# Patient Record
Sex: Female | Born: 1945 | Race: Black or African American | Hispanic: No | Marital: Married | Smoking: Former smoker
Health system: Southern US, Community
[De-identification: ages and names within clinical notes are randomized; demographics above are authoritative.]

## PROBLEM LIST (undated history)

## (undated) DIAGNOSIS — R51 Headache: Secondary | ICD-10-CM

## (undated) DIAGNOSIS — M1990 Unspecified osteoarthritis, unspecified site: Secondary | ICD-10-CM

## (undated) DIAGNOSIS — Z9889 Other specified postprocedural states: Secondary | ICD-10-CM

## (undated) DIAGNOSIS — K219 Gastro-esophageal reflux disease without esophagitis: Secondary | ICD-10-CM

## (undated) DIAGNOSIS — R112 Nausea with vomiting, unspecified: Secondary | ICD-10-CM

## (undated) HISTORY — PX: EYE SURGERY: SHX253

## (undated) HISTORY — PX: APPENDECTOMY: SHX54

---

## 2007-02-04 ENCOUNTER — Ambulatory Visit (HOSPITAL_COMMUNITY): Admission: RE | Admit: 2007-02-04 | Discharge: 2007-02-05 | Payer: Self-pay | Admitting: Orthopedic Surgery

## 2010-11-12 NOTE — Op Note (Signed)
NAME:  Rita Nixon, Rita Nixon              ACCOUNT NO.:  [id number]   MEDICAL RECORD NO.:  [id number]          PATIENT TYPE:  AMB   LOCATION:  DAY                          FACILITY:  [facility]   PHYSICIAN:  Marlowe Kays, M.D.  DATE OF BIRTH:  [birth year]   DATE OF PROCEDURE:  02/04/2007  DATE OF DISCHARGE:                               OPERATIVE REPORT   PREOPERATIVE DIAGNOSES:  1. Torn rotator cuff.  2. Osteoarthritis, acromioclavicular joint, right shoulder.   POSTOPERATIVE DIAGNOSES:  1. Torn rotator cuff.  2. Osteoarthritis, acromioclavicular joint, right shoulder.   OPERATION:  1. Open distal clavicle resection.  2. Open anterior acromionectomy and repair of complex rotator cuff      tear, right shoulder.   SURGEON:  Marlowe Kays, M.D.   ASSISTANTDruscilla Brownie. Idolina Primer, P.A.-C.   ANESTHESIA:  General.   PATHOLOGY AND JUSTIFICATION FOR PROCEDURE:  She has had pain in the  right shoulder since around the first of this year and had an MRI  performed on September 14, 2006 at [facility] which showed a full-  thickness retracted rotator cuff tear.  I saw her in the office on October 05, 2006.  She had tenderness of the [facility] joint was arthritic changes.  I  recommended repair of the rotator cuff; she is here at this time for  this repair.  Preoperatively, she was advised that because of the 4-cm  retraction of the rotator cuff, that it might be a little difficult  completely repairing the rotator cuff and that we might have to have her  arm abducted to her side.   PROCEDURE:  After prophylactic antibiotics, satisfactory general  anesthesia, beach-chair position on the Allen frame, right shoulder  girdle was prepped with DuraPrep and draped into a sterile field, Ioban  employed, time-out performed.  Incision was made along the distal  clavicle, curving inferiorly and laterally over the rotator cuff.  The  [facility] joint was identified and with subperiosteal dissection, the distal  clavicle and joint exposed.  I marked out a centimeter and a half from  the [facility] joint and then undermined the distal clavicle with a small  elevator and then protecting the underneath structures with a baby  Homan, I sectioned the clavicle with a micro saw, removing the fragment  with a towel clip and cautery technique.  Bone wax was placed over the  raw bone and I made sure that there were no remaining spicules.  I then  extended the incision lateral-ward and undermined with some difficulty  the anterior acromion with a small Cobb elevator.  She had very tight  impingement syndrome.  I made my initial anterior acromionectomy with  micro saw and then made several other passes with a saw and with a small  rongeur to obtain adequate decompression.  She had a tight fascia over  the humeral head, which I opened and partially resected.  Her tear was  very significant with basically anterior and posterior halves which I  could not bring completely together, even with the arm abducted.  She  had a very wide biceps tendon  and by utilizing it as a portion of the  repair between these 2 wings, as well as 3 rotator cuff anchors, I was  able to get a very nice repair.  Again, this was all performed with her  arm roughly 60 degrees from her body.  After completing what I  felt was  a stable repair, the wound was irrigated with sterile saline and soft  tissues were infiltrated with 0.5% Marcaine with adrenaline.  Gelfoam  was placed in the distal clavicle resection site and closure performed.  A small opening in the deltoid I closed with interrupted #1 Vicryl, as I  did the fascia over the clavicle and acromion.  The subcutaneous tissue  was closed with 2-0 Vicryl, Steri-Strips on the skin, dry sterile  dressing and shoulder immobilizer were applied with the arm abducted  about 60 degrees.  She tolerated the procedure well and was taken to the  recovery room in satisfactory condition with no known  complications.   ESTIMATED BLOOD LOSS:  Perhaps 100 mL, no blood replacement.           ______________________________  Marlowe Kays, M.D.     JA/MEDQ  D:  02/04/2007  T:  02/05/2007  Job:  [id number]

## 2012-11-24 ENCOUNTER — Other Ambulatory Visit: Payer: Self-pay | Admitting: Orthopedic Surgery

## 2012-11-26 ENCOUNTER — Encounter (HOSPITAL_COMMUNITY): Payer: Self-pay | Admitting: Pharmacy Technician

## 2012-12-01 ENCOUNTER — Encounter (HOSPITAL_COMMUNITY)
Admission: RE | Admit: 2012-12-01 | Discharge: 2012-12-01 | Disposition: A | Discharge status: Home / Self Care | Payer: Medicare Other | Source: Ambulatory Visit | Attending: Orthopedic Surgery | Admitting: Orthopedic Surgery

## 2012-12-01 ENCOUNTER — Encounter (HOSPITAL_COMMUNITY): Payer: Self-pay

## 2012-12-01 DIAGNOSIS — Z01812 Encounter for preprocedural laboratory examination: Secondary | ICD-10-CM | POA: Insufficient documentation

## 2012-12-01 HISTORY — DX: Headache: R51

## 2012-12-01 HISTORY — DX: Gastro-esophageal reflux disease without esophagitis: K21.9

## 2012-12-01 HISTORY — DX: Other specified postprocedural states: Z98.890

## 2012-12-01 HISTORY — DX: Unspecified osteoarthritis, unspecified site: M19.90

## 2012-12-01 HISTORY — DX: Nausea with vomiting, unspecified: R11.2

## 2012-12-01 LAB — CBC
HCT: 36.8 % (ref 36.0–46.0)
Hemoglobin: 12.2 g/dL (ref 12.0–15.0)
RDW: 12.6 % (ref 11.5–15.5)
WBC: 5.8 10*3/uL (ref 4.0–10.5)

## 2012-12-01 LAB — SURGICAL PCR SCREEN
MRSA, PCR: NEGATIVE
Staphylococcus aureus: NEGATIVE

## 2012-12-01 NOTE — Patient Instructions (Addendum)
20      Your procedure is scheduled on:  Wednesday 12/08/2012  Report to [facility] at  0815 AM.  Call this number if you have problems the morning of surgery: [phone]   Remember:             IF YOU USE CPAP,BRING MASK AND TUBING AM OF SURGERY!   Do not eat food or drink liquids AFTER MIDNIGHT!  Take these medicines the morning of surgery with A SIP OF WATER: NONE   Do not bring valuables to the hospital.  .  Leave suitcase in the car. After surgery it may be brought to your room.  For patients admitted to the hospital, checkout time is 11:00 AM the day of              Discharge.    DO NOT WEAR JEWELRY , MAKE-UP, LOTIONS,POWDERS,PERFUMES!             WOMEN -DO NOT SHAVE LEGS OR UNDERARMS 12 HRS. BEFORE  SURGERY!               MEN MAY SHAVE AS USUAL!             CONTACTS,DENTURES OR BRIDGEWORK, FALSE EYELASHES MAY  NOT BE WORN INTO SURGERY!                                           Patients discharged the day of surgery will not be allowed to drive home. If going home the same day of surgery, must have someone stay with you first 24 hrs.at home and arrange for someone to drive you home from the Hospital.                    YOUR DRIVER IS: Conrad-spouse   Special Instructions:             Please read over the following fact sheets that you were given:             1. [facility] PREPARING FOR SURGERY SHEET              2.MRSA INFORMATION              3.[facility]                                        Telford Nab.Nanney,RN,BSN     [phone]                FAILURE TO FOLLOW THESE INSTRUCTIONS MAY RESULT IN  CANCELLATION OF YOUR SURGERY!               Patient Signature:___________________________

## 2012-12-07 NOTE — Progress Notes (Signed)
Surgery time changed from 1045 to 12 on 12/08/12. Pt notified and agreed to be at short stay at 0930.

## 2012-12-08 ENCOUNTER — Encounter (HOSPITAL_COMMUNITY)
Admission: RE | Disposition: A | Discharge status: Home / Self Care | Payer: Self-pay | Source: Ambulatory Visit | Attending: Orthopedic Surgery

## 2012-12-08 ENCOUNTER — Encounter (HOSPITAL_COMMUNITY): Payer: Self-pay | Admitting: Anesthesiology

## 2012-12-08 ENCOUNTER — Ambulatory Visit (HOSPITAL_COMMUNITY): Payer: Medicare Other | Admitting: Anesthesiology

## 2012-12-08 ENCOUNTER — Ambulatory Visit (HOSPITAL_COMMUNITY)
Admission: RE | Admit: 2012-12-08 | Discharge: 2012-12-09 | Disposition: A | Discharge status: Home / Self Care | Payer: Medicare Other | Source: Ambulatory Visit | Attending: Orthopedic Surgery | Admitting: Orthopedic Surgery

## 2012-12-08 ENCOUNTER — Encounter (HOSPITAL_COMMUNITY): Payer: Self-pay

## 2012-12-08 DIAGNOSIS — M67919 Unspecified disorder of synovium and tendon, unspecified shoulder: Secondary | ICD-10-CM | POA: Insufficient documentation

## 2012-12-08 DIAGNOSIS — Z01812 Encounter for preprocedural laboratory examination: Secondary | ICD-10-CM | POA: Insufficient documentation

## 2012-12-08 DIAGNOSIS — Z7982 Long term (current) use of aspirin: Secondary | ICD-10-CM | POA: Insufficient documentation

## 2012-12-08 DIAGNOSIS — M19019 Primary osteoarthritis, unspecified shoulder: Secondary | ICD-10-CM | POA: Insufficient documentation

## 2012-12-08 DIAGNOSIS — K219 Gastro-esophageal reflux disease without esophagitis: Secondary | ICD-10-CM | POA: Insufficient documentation

## 2012-12-08 DIAGNOSIS — Z79899 Other long term (current) drug therapy: Secondary | ICD-10-CM | POA: Insufficient documentation

## 2012-12-08 DIAGNOSIS — M719 Bursopathy, unspecified: Secondary | ICD-10-CM | POA: Insufficient documentation

## 2012-12-08 DIAGNOSIS — M66329 Spontaneous rupture of flexor tendons, unspecified upper arm: Secondary | ICD-10-CM | POA: Insufficient documentation

## 2012-12-08 HISTORY — PX: SHOULDER OPEN ROTATOR CUFF REPAIR: SHX2407

## 2012-12-08 HISTORY — PX: RESECTION DISTAL CLAVICAL: SHX5053

## 2012-12-08 SURGERY — EXCISION, CLAVICLE, DISTAL, OPEN
Anesthesia: General | Site: Shoulder | Laterality: Left | Wound class: Clean

## 2012-12-08 MED ORDER — METOCLOPRAMIDE HCL 5 MG PO TABS
5.0000 mg | ORAL_TABLET | Freq: Three times a day (TID) | ORAL | Status: DC | PRN
  Filled 2012-12-08: qty 2

## 2012-12-08 MED ORDER — PHENOL 1.4 % MT LIQD: 1.0000 | OROMUCOSAL | Status: DC | PRN

## 2012-12-08 MED ORDER — METOCLOPRAMIDE HCL 5 MG/ML IJ SOLN: 5.0000 mg | Freq: Three times a day (TID) | INTRAMUSCULAR | Status: DC | PRN

## 2012-12-08 MED ORDER — BIOTIN 1000 MCG PO TABS: 1000.0000 ug | ORAL_TABLET | Freq: Every day | ORAL | Status: DC

## 2012-12-08 MED ORDER — CELECOXIB 200 MG PO CAPS
200.0000 mg | ORAL_CAPSULE | Freq: Every day | ORAL | Status: DC | PRN
  Filled 2012-12-08: qty 1

## 2012-12-08 MED ORDER — POVIDONE-IODINE 7.5 % EX SOLN
Freq: Once | CUTANEOUS | Status: AC
  Administered 2012-12-08: 1 via TOPICAL

## 2012-12-08 MED ORDER — HYDROMORPHONE HCL PF 1 MG/ML IJ SOLN
INTRAMUSCULAR | Status: AC
  Filled 2012-12-08: qty 1

## 2012-12-08 MED ORDER — PROMETHAZINE HCL 25 MG/ML IJ SOLN: 6.2500 mg | INTRAMUSCULAR | Status: DC | PRN

## 2012-12-08 MED ORDER — SODIUM CHLORIDE 0.9 % IV SOLN
INTRAVENOUS | Status: DC
  Administered 2012-12-08: 1000 mL via INTRAVENOUS

## 2012-12-08 MED ORDER — NEOSTIGMINE METHYLSULFATE 1 MG/ML IJ SOLN
INTRAMUSCULAR | Status: DC | PRN
  Administered 2012-12-08: 5 mg via INTRAVENOUS

## 2012-12-08 MED ORDER — METHOCARBAMOL 500 MG PO TABS
500.0000 mg | ORAL_TABLET | Freq: Four times a day (QID) | ORAL | Status: DC | PRN
  Administered 2012-12-08 – 2012-12-09 (×4): 500 mg via ORAL
  Filled 2012-12-08 (×4): qty 1

## 2012-12-08 MED ORDER — PANTOPRAZOLE SODIUM 40 MG PO TBEC
40.0000 mg | DELAYED_RELEASE_TABLET | Freq: Every day | ORAL | Status: DC
  Administered 2012-12-09: 40 mg via ORAL
  Filled 2012-12-08: qty 1

## 2012-12-08 MED ORDER — FENTANYL CITRATE 0.05 MG/ML IJ SOLN
INTRAMUSCULAR | Status: DC | PRN
  Administered 2012-12-08: 25 ug via INTRAVENOUS
  Administered 2012-12-08: 50 ug via INTRAVENOUS
  Administered 2012-12-08 (×2): 25 ug via INTRAVENOUS
  Administered 2012-12-08: 100 ug via INTRAVENOUS
  Administered 2012-12-08 (×3): 50 ug via INTRAVENOUS

## 2012-12-08 MED ORDER — ONDANSETRON HCL 4 MG/2ML IJ SOLN
INTRAMUSCULAR | Status: DC | PRN
  Administered 2012-12-08: 4 mg via INTRAVENOUS

## 2012-12-08 MED ORDER — ROPIVACAINE HCL 5 MG/ML IJ SOLN
INTRAMUSCULAR | Status: AC
  Filled 2012-12-08: qty 30

## 2012-12-08 MED ORDER — HYDROCODONE-ACETAMINOPHEN 7.5-325 MG PO TABS: 1.0000 | ORAL_TABLET | ORAL | Status: DC | PRN

## 2012-12-08 MED ORDER — PHENYLEPHRINE HCL 10 MG/ML IJ SOLN
10.0000 mg | INTRAVENOUS | Status: DC | PRN
  Administered 2012-12-08: 50 ug/min via INTRAVENOUS

## 2012-12-08 MED ORDER — DEXAMETHASONE SODIUM PHOSPHATE 4 MG/ML IJ SOLN
INTRAMUSCULAR | Status: DC | PRN
  Administered 2012-12-08: 10 mg via INTRAVENOUS

## 2012-12-08 MED ORDER — HYDROMORPHONE HCL PF 1 MG/ML IJ SOLN
0.2500 mg | INTRAMUSCULAR | Status: DC | PRN
  Administered 2012-12-08: 0.5 mg via INTRAVENOUS

## 2012-12-08 MED ORDER — ONDANSETRON HCL 4 MG/2ML IJ SOLN: 4.0000 mg | Freq: Four times a day (QID) | INTRAMUSCULAR | Status: DC | PRN

## 2012-12-08 MED ORDER — DIPHENHYDRAMINE HCL 12.5 MG/5ML PO ELIX: 12.5000 mg | ORAL_SOLUTION | Freq: Four times a day (QID) | ORAL | Status: DC | PRN

## 2012-12-08 MED ORDER — MEPERIDINE HCL 25 MG/ML IJ SOLN: 50.0000 mg | INTRAMUSCULAR | Status: DC | PRN

## 2012-12-08 MED ORDER — BUPIVACAINE-EPINEPHRINE (PF) 0.5% -1:200000 IJ SOLN
INTRAMUSCULAR | Status: AC
  Filled 2012-12-08: qty 10

## 2012-12-08 MED ORDER — DIPHENHYDRAMINE HCL 50 MG/ML IJ SOLN: 12.5000 mg | Freq: Once | INTRAMUSCULAR | Status: DC

## 2012-12-08 MED ORDER — METOCLOPRAMIDE HCL 5 MG/ML IJ SOLN
INTRAMUSCULAR | Status: DC | PRN
  Administered 2012-12-08: 10 mg via INTRAVENOUS

## 2012-12-08 MED ORDER — PANTOPRAZOLE SODIUM 40 MG PO PACK: 40.0000 mg | PACK | Freq: Every day | ORAL | Status: DC

## 2012-12-08 MED ORDER — METHOCARBAMOL 100 MG/ML IJ SOLN
500.0000 mg | Freq: Four times a day (QID) | INTRAMUSCULAR | Status: DC | PRN
  Filled 2012-12-08: qty 5

## 2012-12-08 MED ORDER — HYDROMORPHONE 0.3 MG/ML IV SOLN
INTRAVENOUS | Status: AC
  Filled 2012-12-08: qty 25

## 2012-12-08 MED ORDER — LACTATED RINGERS IV SOLN
INTRAVENOUS | Status: DC
  Administered 2012-12-08: 12:00:00 via INTRAVENOUS
  Administered 2012-12-08: 1000 mL via INTRAVENOUS

## 2012-12-08 MED ORDER — ASPIRIN EC 81 MG PO TBEC
81.0000 mg | DELAYED_RELEASE_TABLET | Freq: Every day | ORAL | Status: DC
  Administered 2012-12-08 – 2012-12-09 (×2): 81 mg via ORAL
  Filled 2012-12-08 (×2): qty 1

## 2012-12-08 MED ORDER — CEFAZOLIN SODIUM-DEXTROSE 2-3 GM-% IV SOLR
2.0000 g | Freq: Four times a day (QID) | INTRAVENOUS | Status: AC
  Administered 2012-12-08 – 2012-12-09 (×3): 2 g via INTRAVENOUS
  Filled 2012-12-08 (×3): qty 50

## 2012-12-08 MED ORDER — HYDROMORPHONE 0.3 MG/ML IV SOLN: INTRAVENOUS | Status: DC

## 2012-12-08 MED ORDER — DIPHENHYDRAMINE HCL 50 MG/ML IJ SOLN: 12.5000 mg | Freq: Four times a day (QID) | INTRAMUSCULAR | Status: DC | PRN

## 2012-12-08 MED ORDER — FENTANYL CITRATE 0.05 MG/ML IJ SOLN: 25.0000 ug | INTRAMUSCULAR | Status: DC | PRN

## 2012-12-08 MED ORDER — CEFAZOLIN SODIUM-DEXTROSE 2-3 GM-% IV SOLR
2.0000 g | INTRAVENOUS | Status: AC
  Administered 2012-12-08: 2 g via INTRAVENOUS

## 2012-12-08 MED ORDER — BUPIVACAINE-EPINEPHRINE 0.5% -1:200000 IJ SOLN
INTRAMUSCULAR | Status: DC | PRN
  Administered 2012-12-08: 15 mL

## 2012-12-08 MED ORDER — DIPHENHYDRAMINE HCL 50 MG/ML IJ SOLN
INTRAMUSCULAR | Status: AC
  Administered 2012-12-08: 50 mg
  Filled 2012-12-08: qty 1

## 2012-12-08 MED ORDER — 0.9 % SODIUM CHLORIDE (POUR BTL) OPTIME
TOPICAL | Status: DC | PRN
  Administered 2012-12-08: 1000 mL

## 2012-12-08 MED ORDER — MEPERIDINE HCL 50 MG PO TABS
50.0000 mg | ORAL_TABLET | ORAL | Status: DC | PRN
  Administered 2012-12-08 – 2012-12-09 (×6): 50 mg via ORAL
  Filled 2012-12-08 (×6): qty 1

## 2012-12-08 MED ORDER — ONDANSETRON HCL 4 MG PO TABS: 4.0000 mg | ORAL_TABLET | Freq: Four times a day (QID) | ORAL | Status: DC | PRN

## 2012-12-08 MED ORDER — MENTHOL 3 MG MT LOZG: 1.0000 | LOZENGE | OROMUCOSAL | Status: DC | PRN

## 2012-12-08 MED ORDER — GLYCOPYRROLATE 0.2 MG/ML IJ SOLN
INTRAMUSCULAR | Status: DC | PRN
  Administered 2012-12-08: 0.6 mg via INTRAVENOUS

## 2012-12-08 MED ORDER — PROPOFOL 10 MG/ML IV BOLUS
INTRAVENOUS | Status: DC | PRN
  Administered 2012-12-08: 170 mg via INTRAVENOUS

## 2012-12-08 MED ORDER — FENTANYL CITRATE 0.05 MG/ML IJ SOLN
INTRAMUSCULAR | Status: AC
  Filled 2012-12-08: qty 2

## 2012-12-08 MED ORDER — CEFAZOLIN SODIUM-DEXTROSE 2-3 GM-% IV SOLR
INTRAVENOUS | Status: AC
  Filled 2012-12-08: qty 50

## 2012-12-08 MED ORDER — MIDAZOLAM HCL 5 MG/5ML IJ SOLN
INTRAMUSCULAR | Status: DC | PRN
  Administered 2012-12-08: 1 mg via INTRAVENOUS
  Administered 2012-12-08: 2 mg via INTRAVENOUS

## 2012-12-08 MED ORDER — ROCURONIUM BROMIDE 100 MG/10ML IV SOLN
INTRAVENOUS | Status: DC | PRN
  Administered 2012-12-08: 50 mg via INTRAVENOUS

## 2012-12-08 SURGICAL SUPPLY — 43 items
ANCH SUT 2 5.5 BABSR ASCP (Orthopedic Implant) ×1 IMPLANT
ANCHOR PEEK ZIP 5.5 NDL NO2 (Orthopedic Implant) ×1 IMPLANT
BAG SPEC THK2 15X12 ZIP CLS (MISCELLANEOUS) ×1
BAG ZIPLOCK 12X15 (MISCELLANEOUS) ×2 IMPLANT
BLADE OSCILLATING/SAGITTAL (BLADE) ×2
BLADE SW THK.38XMED LNG THN (BLADE) ×1 IMPLANT
CLEANER TIP ELECTROSURG 2X2 (MISCELLANEOUS) ×2 IMPLANT
CLOTH BEACON ORANGE TIMEOUT ST (SAFETY) ×2 IMPLANT
CONT SPECI 4OZ STER CLIK (MISCELLANEOUS) ×1 IMPLANT
COVER SURGICAL LIGHT HANDLE (MISCELLANEOUS) ×1 IMPLANT
DRAPE LG THREE QUARTER DISP (DRAPES) ×2 IMPLANT
DRAPE POUCH INSTRU U-SHP 10X18 (DRAPES) ×2 IMPLANT
DRAPE U-SHAPE 47X51 STRL (DRAPES) ×2 IMPLANT
DRSG EMULSION OIL 3X3 NADH (GAUZE/BANDAGES/DRESSINGS) ×3 IMPLANT
DRSG PAD ABDOMINAL 8X10 ST (GAUZE/BANDAGES/DRESSINGS) ×1 IMPLANT
DURAPREP 26ML APPLICATOR (WOUND CARE) ×2 IMPLANT
ELECT REM PT RETURN 9FT ADLT (ELECTROSURGICAL) ×2
ELECTRODE REM PT RTRN 9FT ADLT (ELECTROSURGICAL) ×1 IMPLANT
FACESHIELD LNG OPTICON STERILE (SAFETY) ×4 IMPLANT
GLOVE BIOGEL M 8.0 STRL (GLOVE) ×2 IMPLANT
GLOVE ECLIPSE 8.0 STRL XLNG CF (GLOVE) ×2 IMPLANT
GLOVE INDICATOR 8.0 STRL GRN (GLOVE) ×6 IMPLANT
GOWN STRL REIN XL XLG (GOWN DISPOSABLE) ×5 IMPLANT
KIT BASIN OR (CUSTOM PROCEDURE TRAY) ×2 IMPLANT
MANIFOLD NEPTUNE II (INSTRUMENTS) ×2 IMPLANT
NDL MA TROC 1/2 (NEEDLE) IMPLANT
NEEDLE MA TROC 1/2 (NEEDLE) IMPLANT
NS IRRIG 1000ML POUR BTL (IV SOLUTION) ×2 IMPLANT
PACK SHOULDER CUSTOM OPM052 (CUSTOM PROCEDURE TRAY) ×2 IMPLANT
POSITIONER SURGICAL ARM (MISCELLANEOUS) ×2 IMPLANT
SLING ARM FOAM STRAP MED (SOFTGOODS) ×2 IMPLANT
SLING ARM IMMOBILIZER LRG (SOFTGOODS) ×1 IMPLANT
SPONGE GAUZE 4X4 12PLY (GAUZE/BANDAGES/DRESSINGS) ×2 IMPLANT
SPONGE SURGIFOAM ABS GEL 12-7 (HEMOSTASIS) ×2 IMPLANT
STAPLER VISISTAT 35W (STAPLE) ×2 IMPLANT
SUT BONE WAX W31G (SUTURE) ×2 IMPLANT
SUT ETHIBOND 2 OS 4 DA (SUTURE) ×1 IMPLANT
SUT ETHIBOND NAB CT1 #1 30IN (SUTURE) ×1 IMPLANT
SUT VIC AB 1 CT1 27 (SUTURE) ×4
SUT VIC AB 1 CT1 27XBRD ANTBC (SUTURE) ×2 IMPLANT
SUT VIC AB 2-0 CT1 27 (SUTURE) ×4
SUT VIC AB 2-0 CT1 27XBRD (SUTURE) ×2 IMPLANT
TAPE CLOTH SURG 6X10 WHT LF (GAUZE/BANDAGES/DRESSINGS) ×1 IMPLANT

## 2012-12-08 NOTE — Transfer of Care (Signed)
Immediate Anesthesia Transfer of Care Note  Patient: Rita Nixon  Procedure(s) Performed: Procedure(s): LEFT SHOULDER OPEN DISTAL CLAVICLE RESECTION, ANTERIOR ACROMINECTOMY,  (Left) ROTATOR CUFF REPAIR/ BICEP  TENDON REPAIR (Left)  Patient Location: PACU  Anesthesia Type:General  Level of Consciousness: awake, sedated, patient cooperative and responds to stimulation, drowsy, ventilating well, appropriate  Airway & Oxygen Therapy: Patient Spontanous Breathing and Patient connected to face mask oxygen  Post-op Assessment: Report given to PACU RN, Post -op Vital signs reviewed and stable and Patient moving all extremities X 4  Post vital signs: Reviewed and stable  Complications: No apparent anesthesia complications

## 2012-12-08 NOTE — Progress Notes (Signed)
PHARMACIST - PHYSICIAN ORDER COMMUNICATION  CONCERNING: P&T Medication Policy on Herbal Medications  DESCRIPTION:  This patient's order for biotin has been noted.  This product(s) is classified as an "herbal" or natural product. Due to a lack of definitive safety studies or FDA approval, nonstandard manufacturing practices, plus the potential risk of unknown drug-drug interactions while on inpatient medications, the Pharmacy and Therapeutics Committee does not permit the use of "herbal" or natural products of this type within [facility].   ACTION TAKEN: The pharmacy department is unable to verify this order at this time and your patient has been informed of this safety policy. Please reevaluate patient's clinical condition at discharge and address if the herbal or natural product(s) should be resumed at that time.  Juliette Alcide, PharmD, BCPS.   Pager: [phone] 12/08/2012 3:31 PM

## 2012-12-08 NOTE — H&P (Signed)
Rita Nixon is an 67 y.o. female.   Chief Complaint: painful lt shoulder HPI: MRI demonstrates partial biceps tendon tear, torn rotator cuff, ac arthritis  Past Medical History  Diagnosis Date  . PONV (postoperative nausea and vomiting)   . Headache(784.0)   . GERD (gastroesophageal reflux disease)   . Arthritis     Past Surgical History  Procedure Laterality Date  . Appendectomy      age 77  . Eye surgery      bilateral cataract with lens implants    History reviewed. No pertinent family history. Social History:  reports that she does not drink alcohol or use illicit drugs. Her tobacco history is not on file.  Allergies:  Allergies  Allergen Reactions  . Percocet (Oxycodone-Acetaminophen)     Patient does not like it    Medications Prior to Admission  Medication Sig Dispense Refill  . aspirin EC 81 MG tablet Take 81 mg by mouth daily.      . Biotin 1000 MCG tablet Take 1,000 mcg by mouth daily.      . celecoxib (CELEBREX) 200 MG capsule Take 200 mg by mouth daily as needed for pain.      Marland Kitchen esomeprazole (NEXIUM) 40 MG capsule Take 40 mg by mouth daily as needed (heartburn).      . Flaxseed, Linseed, (FLAX SEED OIL) 1000 MG CAPS Take 1 capsule by mouth daily.      Marland Kitchen ibuprofen (ADVIL,MOTRIN) 200 MG tablet Take 800 mg by mouth every 6 (six) hours as needed for pain.      . vitamin B-12 (CYANOCOBALAMIN) 1000 MCG tablet Take 1,000 mcg by mouth daily.        No results found for this or any previous visit (from the past 48 hour(s)). No results found.  ROS  Blood pressure 170/89, pulse 67, temperature 97.6 F (36.4 C), temperature source Oral, resp. rate 18, SpO2 100.00%. Physical Exam  Constitutional: She is oriented to person, place, and time. She appears well-developed and well-nourished.  HENT:  Head: Normocephalic and atraumatic.  Right Ear: External ear normal.  Left Ear: External ear normal.  Nose: Nose normal.  Mouth/Throat: Oropharynx is clear and moist.   Eyes: Conjunctivae and EOM are normal. Pupils are equal, round, and reactive to light.  Neck: Normal range of motion. Neck supple.  Cardiovascular: Normal rate, regular rhythm, normal heart sounds and intact distal pulses.   Respiratory: Effort normal and breath sounds normal.  GI: Soft. Bowel sounds are normal.  Musculoskeletal:  Left shoulder--painful,decreased ROM.  Tender rotator cuff  Neurological: She is alert and oriented to person, place, and time. She has normal reflexes.  Skin: Skin is warm and dry.  Psychiatric: She has a normal mood and affect. Her behavior is normal. Judgment and thought content normal.     Assessment/Plan Left shoulder--partial tear biceps tendon, rotator cuff tear, ac arthritis Open distal clavicle resection, anterior acromionectomy with repair torn rotator cuff, repair biceps tendon  APLINGTON,JAMES P 12/08/2012, 11:46 AM

## 2012-12-08 NOTE — Anesthesia Postprocedure Evaluation (Signed)
  Anesthesia Post-op Note  Patient: Rita Nixon  Procedure(s) Performed: Procedure(s) (LRB): LEFT SHOULDER OPEN DISTAL CLAVICLE RESECTION, ANTERIOR ACROMINECTOMY,  (Left) ROTATOR CUFF REPAIR/ BICEP  TENDON REPAIR (Left)  Patient Location: PACU  Anesthesia Type: General  Level of Consciousness: awake and alert   Airway and Oxygen Therapy: Patient Spontanous Breathing  Post-op Pain: mild  Post-op Assessment: Post-op Vital signs reviewed, Patient's Cardiovascular Status Stable, Respiratory Function Stable, Patent Airway and No signs of Nausea or vomiting  Last Vitals:  Filed Vitals:   12/08/12 1615  BP: 152/100  Pulse: 73  Temp: 36.5 C  Resp:     Post-op Vital Signs: stable   Complications: No apparent anesthesia complications

## 2012-12-08 NOTE — Anesthesia Preprocedure Evaluation (Signed)
Anesthesia Evaluation  Patient identified by MRN, date of birth, ID band Patient awake    Reviewed: Allergy & Precautions, H&P , NPO status , Patient's Chart, lab work & pertinent test results  History of Anesthesia Complications (+) PONV  Airway Mallampati: II TM Distance: >3 FB Neck ROM: Full    Dental no notable dental hx.    Pulmonary neg pulmonary ROS,  breath sounds clear to auscultation  Pulmonary exam normal       Cardiovascular Exercise Tolerance: Good negative cardio ROS  Rhythm:Regular Rate:Normal     Neuro/Psych  Headaches, negative psych ROS   GI/Hepatic Neg liver ROS, GERD-  Medicated,  Endo/Other  negative endocrine ROS  Renal/GU negative Renal ROS  negative genitourinary   Musculoskeletal negative musculoskeletal ROS (+)   Abdominal   Peds negative pediatric ROS (+)  Hematology negative hematology ROS (+)   Anesthesia Other Findings   Reproductive/Obstetrics negative OB ROS                           Anesthesia Physical Anesthesia Plan  ASA: II  Anesthesia Plan: General   Post-op Pain Management:    Induction: Intravenous  Airway Management Planned: Oral ETT  Additional Equipment:   Intra-op Plan:   Post-operative Plan: Extubation in OR  Informed Consent: I have reviewed the patients History and Physical, chart, labs and discussed the procedure including the risks, benefits and alternatives for the proposed anesthesia with the patient or authorized representative who has indicated his/her understanding and acceptance.   Dental advisory given  Plan Discussed with: CRNA  Anesthesia Plan Comments: (Discussed risks and benefits of interscalene block including failure, bleeding, infection, nerve damage, weakness. Questions answered. Patient consents to block.)        Anesthesia Quick Evaluation

## 2012-12-08 NOTE — Progress Notes (Signed)
Pain med dilaudid given IV; pt immediately c/o itching right arm and red splotches/hives noted right arm; Dr. Council Mechanic anesthesiologist called, med ordered and benadryl given; also notified Dr. Simonne Come about reaction and  d/c'ed Dilaudid PCA and new orders given for pain med

## 2012-12-08 NOTE — Preoperative (Signed)
Beta Blockers   Reason not to administer Beta Blockers:Not Applicable, not on home BB 

## 2012-12-09 ENCOUNTER — Encounter (HOSPITAL_COMMUNITY): Payer: Self-pay | Admitting: Orthopedic Surgery

## 2012-12-09 MED ORDER — MEPERIDINE HCL 50 MG PO TABS: 50.0000 mg | ORAL_TABLET | ORAL | Status: AC | PRN

## 2012-12-09 MED ORDER — METHOCARBAMOL 500 MG PO TABS: 500.0000 mg | ORAL_TABLET | Freq: Four times a day (QID) | ORAL | Status: AC | PRN

## 2012-12-09 NOTE — Progress Notes (Signed)
Occupational Therapy Treatment Patient Details Name: Rita Nixon MRN: [id number] DOB: 08/31/1945 Today's Date: 12/09/2012 Time: 4098-1191 OT Time Calculation (min): 9 min  OT Assessment / Plan / Recommendation Comments on Treatment Session      Follow Up Recommendations   (will follow up with Dr Simonne Come)    Barriers to Discharge       Equipment Recommendations       Recommendations for Other Services    Frequency Min 2X/week   Plan      Precautions / Restrictions Precautions Precautions: Shoulder Type of Shoulder Precautions: sling at all times:  sling and adls Precaution Booklet Issued: Yes (comment)   Pertinent Vitals/Pain Pain OK, L shoulder    ADL  Upper Body Dressing: Maximal assistance Where Assessed - Upper Body Dressing: Unsupported sitting ADL Comments: Family present.  Reviewed protocol, donned shirt and family member donned sling.  Verbalizes understanding of all education    OT Diagnosis:    OT Problem List:   OT Treatment Interventions:     OT Goals Miscellaneous OT Goals Miscellaneous OT Goal #1: Family will verbalize understanding of shoulder precautions during adls OT Goal: Miscellaneous Goal #1 - Progress: Met Miscellaneous OT Goal #2: Family will be independent donning sling OT Goal: Miscellaneous Goal #2 - Progress: Met  Visit Information  Last OT Received On: 12/09/12 Assistance Needed: +1    Subjective Data      Prior Functioning       Cognition       Mobility  Transfers Sit to Stand: 7: Independent Details for Transfer Assistance: pt moving around room safely    Exercises  Donning/doffing shirt without moving shoulder: Patient able to independently direct caregiver Method for sponge bathing under operated UE: Patient able to independently direct caregiver Donning/doffing sling/immobilizer: Caregiver independent with task Correct positioning of sling/immobilizer: Caregiver independent with task Sling wearing schedule (on  at all times/off for ADL's): Patient able to independently direct caregiver Proper positioning of operated UE when showering: Patient able to independently direct caregiver Positioning of UE while sleeping: Patient able to independently direct caregiver   Balance     End of Session OT - End of Session Activity Tolerance: Patient tolerated treatment well Patient left: in chair;with call bell/phone within reach  GO Self Care Discharge Status [phone]): At least 60 percent but less than 80 percent impaired, limited or restricted   [name] 12/09/2012, 3:14 PM Marica Otter, OTR/L [phone] 12/09/2012

## 2012-12-09 NOTE — Evaluation (Signed)
Occupational Therapy Evaluation Patient Details Name: Rita Nixon MRN: [id number] DOB: 1946/05/19 Today's Date: 12/09/2012 Time: 4098-1191 OT Time Calculation (min): 36 min  OT Assessment / Plan / Recommendation Clinical Impression  This 67 year old female was admitted for L RCR and repair of long head of biceps.  Education was completed with pt.  Plan to return one more time to educate family and have them practice with sling prior to discharge.    OT Assessment  Patient needs continued OT Services    Follow Up Recommendations   (pt will follow up with dr Simonne Come for further rehab)    Barriers to Discharge      Equipment Recommendations  None recommended by OT    Recommendations for Other Services    Frequency  Min 2X/week    Precautions / Restrictions Precautions Precautions: Shoulder Type of Shoulder Precautions: sling at all times:  sling and adls Precaution Booklet Issued: Yes (comment) Restrictions Weight Bearing Restrictions: Yes LLE Weight Bearing: Non weight bearing   Pertinent Vitals/Pain *4-5/10.  Repositioned with ice    ADL  Upper Body Bathing: Moderate assistance Where Assessed - Upper Body Bathing: Unsupported sitting Upper Body Dressing: Maximal assistance Where Assessed - Upper Body Dressing: Unsupported sitting Lower Body Dressing: Moderate assistance Where Assessed - Lower Body Dressing: Unsupported sit to stand Toilet Transfer: Supervision/safety Toilet Transfer Method: Sit to stand Toilet Transfer Equipment: Comfort height toilet Transfers/Ambulation Related to ADLs: ambulated to bathroom with supervision ADL Comments: reviewed shoulder protocol and worked through [employer].  Gave written instructions.  Will return when family is here so they can practice sling    OT Diagnosis: Generalized weakness;Acute pain  OT Problem List: Pain;Decreased strength;Decreased knowledge of precautions;Impaired UE functional use OT Treatment Interventions:  Self-care/ADL training;Patient/family education   OT Goals Acute Rehab OT Goals OT Goal Formulation: With patient Time For Goal Achievement: 12/11/12 Potential to Achieve Goals: Good Miscellaneous OT Goals Miscellaneous OT Goal #1: Family will verbalize understanding of shoulder precautions during adls OT Goal: Miscellaneous Goal #1 - Progress: Goal set today Miscellaneous OT Goal #2: Family will be independent donning sling OT Goal: Miscellaneous Goal #2 - Progress: Goal set today  Visit Information  Last OT Received On: 12/09/12 Assistance Needed: +1    Subjective Data  Subjective: I had the other one done 8 years ago Patient Stated Goal: be as independent as possible   Prior Functioning     Home Living Lives With: Spouse Available Help at Discharge: Family Bathroom Shower/Tub: Engineer, manufacturing systems: Standard Additional Comments: family can help:  CNA and LP in family.  Husband works days Prior Function Level of Independence: IT trainer: No difficulties Dominant Hand: Right         Vision/Perception     Cognition  Cognition Arousal/Alertness: Awake/alert Behavior During Therapy: WFL for tasks assessed/performed Overall Cognitive Status: Within Functional Limits for tasks assessed    Extremity/Trunk Assessment Right Upper Extremity Assessment RUE ROM/Strength/Tone: WFL for tasks assessed Left Upper Extremity Assessment LUE ROM/Strength/Tone: Deficits;Due to precautions     Mobility Bed Mobility Bed Mobility: Supine to Sit Supine to Sit: 5: Supervision;HOB elevated Transfers Transfers: Sit to Stand Sit to Stand: 5: Supervision     Exercise Method for sponge bathing under operated UE:  (verbalizes) Sling wearing schedule (on at all times/off for ADL's):  (verbalizes) Proper positioning of operated UE when showering:  (verbalizes) Positioning of UE while sleeping:  (verbalizes)   Balance     End of Session  OT  - End of Session Activity Tolerance: Patient tolerated treatment well Patient left: in chair;with call bell/phone within reach  GO Functional Assessment Tool Used: clinical observation Functional Limitation: Self care Self Care Current Status (Z6109): At least 60 percent but less than 80 percent impaired, limited or restricted Self Care Goal Status (U0454): At least 60 percent but less than 80 percent impaired, limited or restricted Self Care Discharge Status [phone]): At least 60 percent but less than 80 percent impaired, limited or restricted   [facility] 12/09/2012, 9:52 AM Marica Otter, OTR/L [phone] 12/09/2012

## 2012-12-09 NOTE — Care Management Note (Signed)
    Page 1 of 1   12/09/2012     1:01:28 PM   CARE MANAGEMENT NOTE 12/09/2012  Patient:  Rita Nixon, Rita Nixon   Account Number:  [id number]  Date Initiated:  12/09/2012  Documentation initiated by:  Colleen Can  Subjective/Objective Assessment:   DX Left shoulder rotator cuff repair     Action/Plan:   CM spoke with patient. Plans are for her to return to her home in [place] where  spouse and other family member will be caregivers. She is ambulatory. Pt will not require HH or DME services.   Anticipated DC Date:  12/09/2012   Anticipated DC Plan:  HOME/SELF CARE      DC Planning Services  CM consult      Choice offered to / List presented to:             Status of service:  Completed, signed off Medicare Important Message given?   (If response is "NO", the following Medicare IM given date fields will be blank) Date Medicare IM given:   Date Additional Medicare IM given:    Discharge Disposition:    Per UR Regulation:  Reviewed for med. necessity/level of care/duration of stay  If discussed at Long Length of Stay Meetings, dates discussed:    Comments:

## 2012-12-09 NOTE — Progress Notes (Signed)
Patient ID: Rita Nixon, female   DOB: 1946-05-03, 68 y.o.   MRN: [id number] Doing well.  OK on oral pain med.  Will discharge.  Niece is CMA and can change dressing in 3 days.  Post-op inst given.

## 2012-12-09 NOTE — Op Note (Signed)
NAMEALBERT, Rita Nixon NO.:  [id number]  MEDICAL RECORD NO.:  [id number]  LOCATION:  1618                         FACILITY:  [facility]  PHYSICIAN:  Marlowe Kays, M.D.  DATE OF BIRTH:  03-03-1946  DATE OF PROCEDURE:  12/08/2012 DATE OF DISCHARGE:                              OPERATIVE REPORT   PREOPERATIVE DIAGNOSIS: 1. AC joint arthritis. 2. Torn retracted rotator cuff tear. 3. Partially torn long head biceps tendon.  POSTOPERATIVE DIAGNOSIS: 1. AC joint arthritis. 2. Torn retracted rotator cuff tear. 3. Partially torn long head biceps tendon.  OPERATION: 1. Open distal clavicle resection. 2. Open anterior acromionectomy with repair of torn rotator cuff. 3. Open repair of long head biceps tendon.  SURGEON:  Marlowe Kays, M.D.  ASSISTANTDruscilla Brownie. Cherlynn June.  ANESTHESIA:  No interscalene block, satisfied general anesthesia, and prophylactic antibiotics.  Beach-chair position on the sliding frame, left shoulder girdle was prepped with DuraPrep and draped in sterile field.  PATHOLOGY AND JUSTIFICATION FOR PROCEDURE:  Painful left shoulder with an MRI indicating preoperative diagnoses, which were correct at surgery. Mr. Angie Fava assistance was required because of the complexity of the operation including holding the arm, moving the arms, and assistance with instruments.  PROCEDURE IN DETAIL:  Prophylactic antibiotics.  Beach chair position on the sliding frame, left shoulder girdle was prepped with DuraPrep, and draped in sterile field.  Time-out was performed.  I made incision over the distal clavicle and then going laterally and slightly anteriorly to give better exposure for the anterior rotator cuff and the biceps tendon.  The [facility] joint was identified and measured a 1.5 cm medial to it. With small elevator I isolated the lateral clavicle and then placed baby Bennett retractors anteriorly and posteriorly in subperiosteal fashion to  protect the underlying structures, and I then used the microsaw to amputate the clavicle at the scribe mark on the clavicle.  The left distal clavicle was removed with towel clip and cutting cautery technique.  The remaining spicules of bone were removed, and raw bone was covered with bone wax.  I then continued the incision lateral and anterior and with subperiosteal dissection exposed the anterior acromion.  With Cobb elevator protecting the underneath structures, I performed my first anterior acromionectomy decompression followed by several other passes with microsaw and rongeur until we had a wide decompression.  The pathology was then noted.  She had whole anterior half to two-thirds of the rotator cuff were avulsed and retracted posteriorly.  Fortunately, the flap was flexible.  Biceps tendon as noted on the MRI was subluxed medially and also had number split tears, but was basically intact.  Accordingly, I first repaired the tendon with multiple interrupted 2-0 Ethibond suture.  I then freed up the rotator cuff, and placed a 4-stranded Stryker anchor.  At the level greater tuberosity, I roughened up the articular cartilage just medial to the greater tuberosity and then a 4 strands through the entire rotator cuff bringing the rotator cuff anteriorly and lateralward with a nice stabilization.  At this point, tying the sutures.  I then supplemented the repair using the same suture with additional sutures laterally and anteriorly.  This seemed to  give a nice stable repair with her arm to her side.  I then irrigated the wound well with sterile saline and infiltrated the soft tissues with 0.5% Marcaine with adrenaline.  I placed Gelfoam in the distal clavicle resection gap and closed the wound with interrupted #1 Vicryl in the deltoid muscle and fascia over the remaining anterior acromion and distal clavicle.  Subcutaneous tissue was closed with 2-0 Vicryl, staples in the skin.  Betadine,  Adaptic, and dry sterile dressing were applied.  She tolerated the procedure well, was taken to recovery room in satisfied condition with no known complications.          ______________________________ Marlowe Kays, M.D.     JA/MEDQ  D:  12/08/2012  T:  12/09/2012  Job:  [id number]

## 2012-12-10 NOTE — Discharge Summary (Signed)
[name], [name] NO.:  [id number]  MEDICAL RECORD NO.:  [id number]  LOCATION:  1618                         FACILITY:  [facility]  PHYSICIAN:  Marlowe Kays, M.D.  DATE OF BIRTH:  09-13-1945  DATE OF ADMISSION:  12/08/2012 DATE OF DISCHARGE:  12/09/2012                              DISCHARGE SUMMARY   ADMITTING DIAGNOSES: 1. Torn rotator cuff. 2. Acromioclavicular joint arthritis. 3. Partial tear of long head biceps tendon, left shoulder.  DISCHARGE DIAGNOSES: 1. Torn rotator cuff. 2. Acromioclavicular joint arthritis. 3. Partial tear of long head biceps tendon, left shoulder.  OPERATIONS: 1. Open distal clavicle resection. 2. Open anterior acromionectomy with repair of torn rotator cuff. 3. Repair of partial tear of long head biceps tendon, left shoulder on     December 08, 2012.  SUMMARY:  This 67 year old female has been a long-time patient of mine. I had previously performed rotator cuff repair on her right shoulder, and she has done well with it.  Because of progressive pain in her left shoulder, she came to see me and had an MRI performed, which demonstrated the admission diagnoses.  Consequently, she came for the above-mentioned surgery.  Preoperative laboratory data was unremarkable. The surgery went without complication, and then, there were no postop complications.  At discharge, she was afebrile, ambulatory, and comfortable on oral pain medication.  She will be discharged on her home medications as well as Robaxin 500 mg every 6 hours p.r.n., #30 with a refill and Demerol 50 mg tablets 1-2 every 4 hours p.r.n. for pain.  Her niece is a CMA and will change dressing in 3 days.  She will be given some dressings and tape to take home.  If there is no appreciable drainage, she can be showering.  She was given instructions and using the shoulder immobilizer, dressing, and undressing, and showering. Return to my office in 2 weeks from surgery and call  [phone] for appointment.  If any problems, she will give [place] a call sooner.          ______________________________ Marlowe Kays, M.D.     JA/MEDQ  D:  12/09/2012  T:  12/10/2012  Job:  [id number]

## 2014-11-21 ENCOUNTER — Other Ambulatory Visit: Payer: Self-pay | Admitting: Surgery

## 2014-11-21 DIAGNOSIS — Q438 Other specified congenital malformations of intestine: Secondary | ICD-10-CM

## 2014-12-04 ENCOUNTER — Ambulatory Visit
Admission: RE | Admit: 2014-12-04 | Discharge: 2014-12-04 | Disposition: A | Discharge status: Home / Self Care | Payer: Medicare Other | Source: Ambulatory Visit | Attending: Surgery | Admitting: Surgery

## 2014-12-04 DIAGNOSIS — Q438 Other specified congenital malformations of intestine: Secondary | ICD-10-CM

## 2016-12-18 IMAGING — CT CT VIRTUAL COLONOSCOPY DIAGNOSTIC
4 of 9 series · 12 of 36 positions shown, 18 images · non-contrast
Comparison: None.

CLINICAL DATA: Incomplete colonoscopy in Monday September, 2014 due to a
tortuous colon, family history of carcinoma of the colon

EXAM:
CT VIRTUAL COLONOSCOPY DIAGNOSTIC
TECHNIQUE: The patient was given a standard magnesium citrate and suppositories
bowel preparation with Gastrografin and barium for fluid and stool
tagging respectively. The quality of the bowel preparation is
moderate to poor. Automated CO2 insufflation of the colon was
performed prior to image acquisition and colonic distention is
moderate to poor. Image post processing was used to generate a 3D
endoluminal fly-through projection of the colon and to
electronically subtract stool/fluid as appropriate.

[Series 2: supine (id) · axial · 0.76mm/px · z∈[-298,-14]mm · 4 of 381 slices shown]
[im 77/381  soft-tissue]
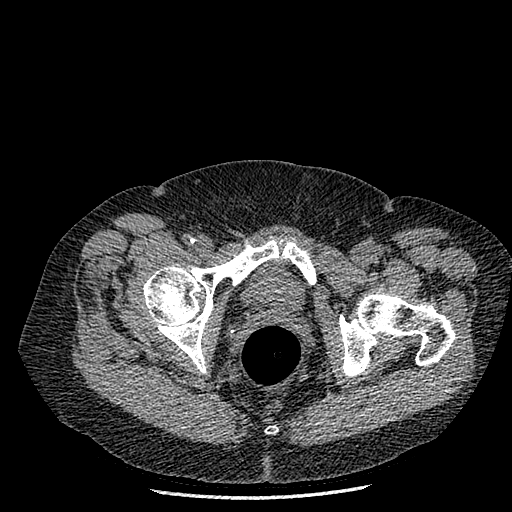
[im 153/381  soft-tissue]
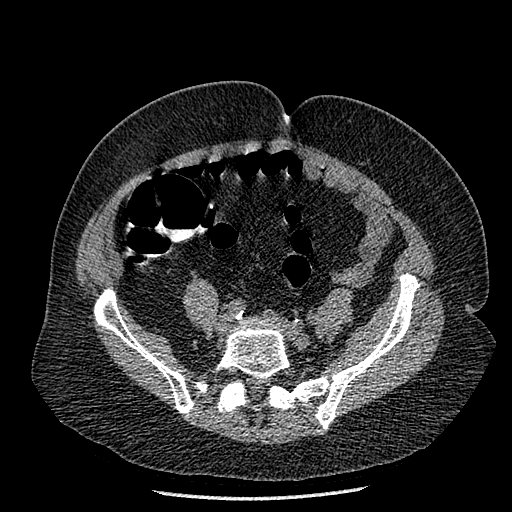
[im 229/381  soft-tissue]
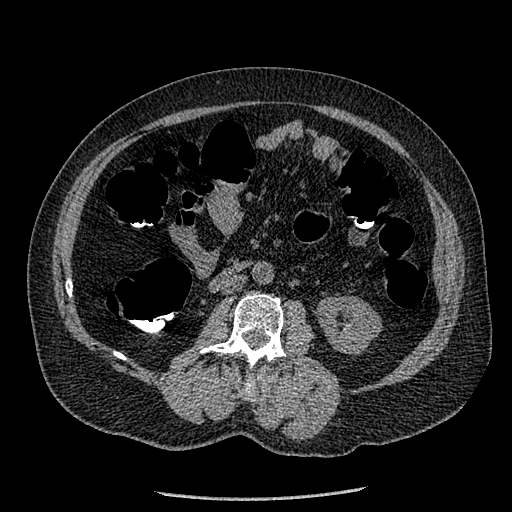
[im 305/381  soft-tissue]
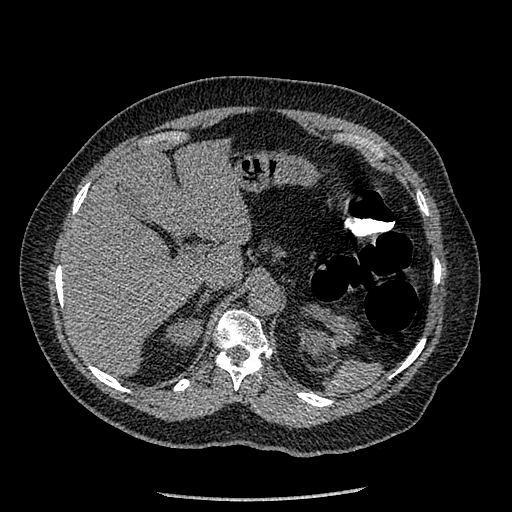

[Series 6: prone (id) · axial · 0.76mm/px · z∈[-350,-9]mm · 5 of 411 slices shown, 10 images]
[im 69/411  soft-tissue]
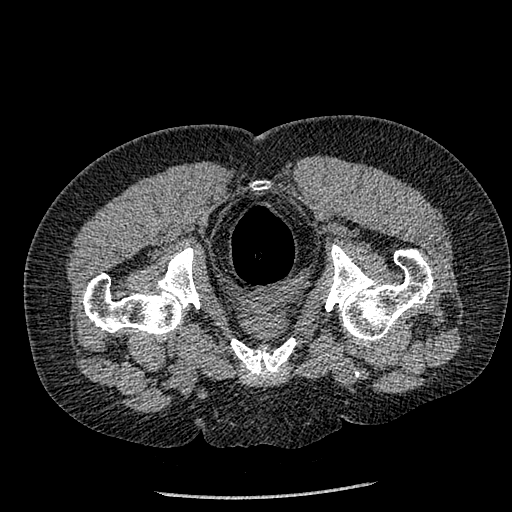
[im 69/411  bone]
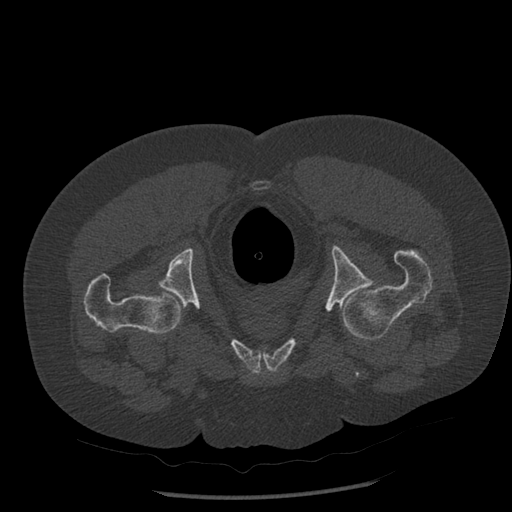
[im 137/411  soft-tissue]
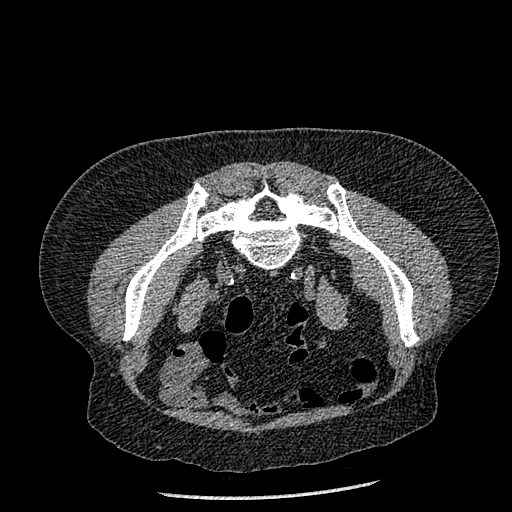
[im 137/411  lung]
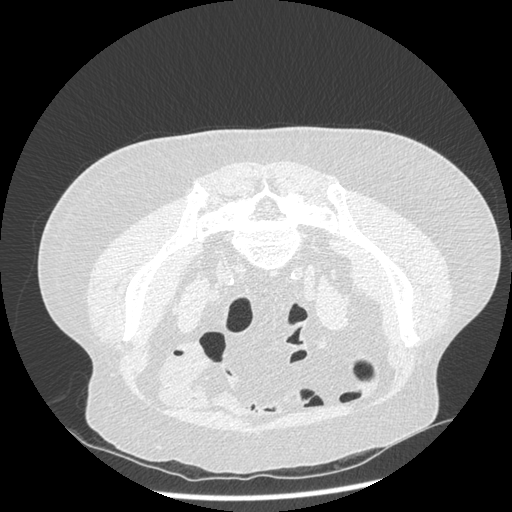
[im 206/411  soft-tissue]
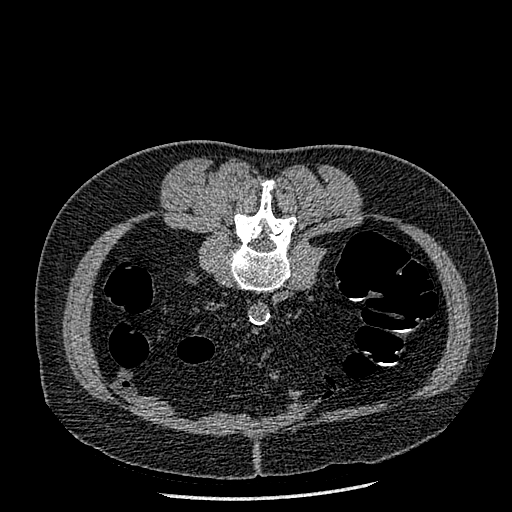
[im 206/411  lung]
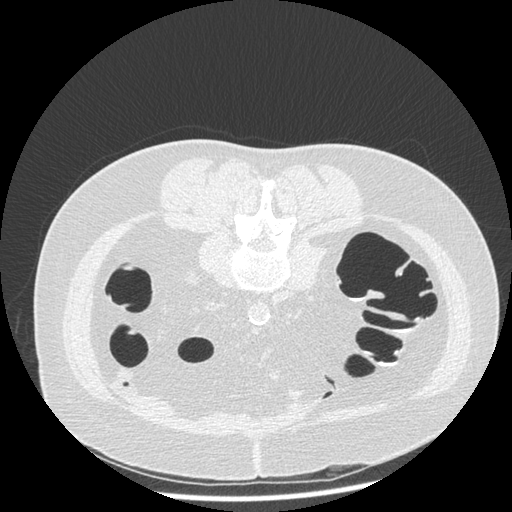
[im 274/411  soft-tissue]
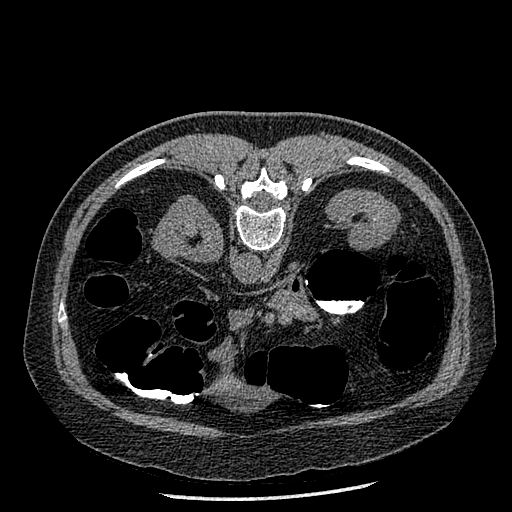
[im 274/411  lung]
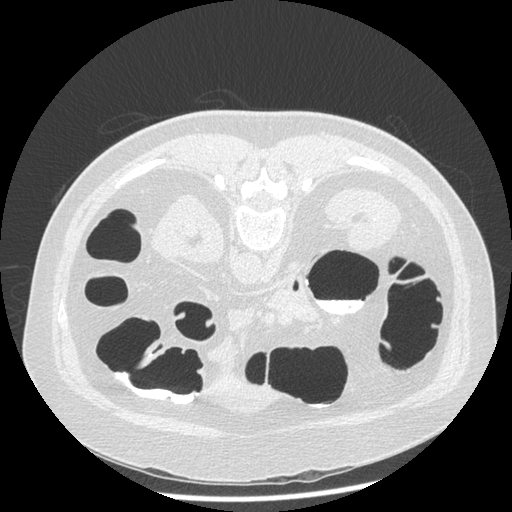
[im 342/411  soft-tissue]
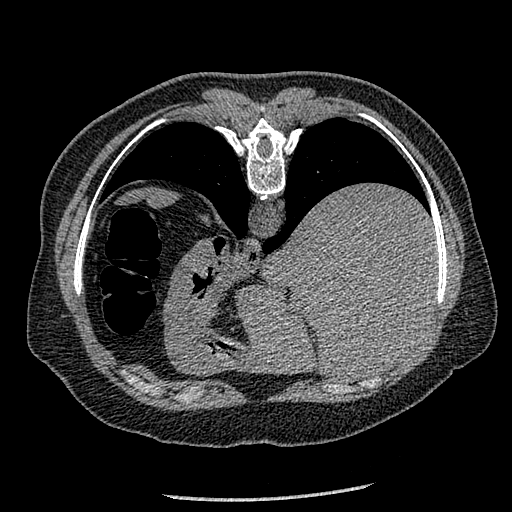
[im 342/411  lung]
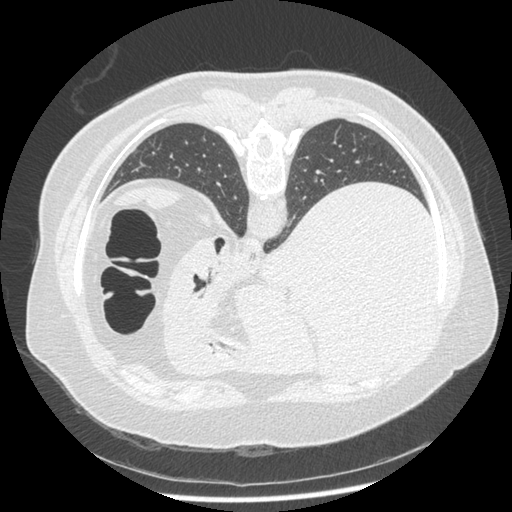

[Series 9: right decub 1.25 · axial · 0.76mm/px · z∈[-334,-239]mm · 2 of 383 slices shown]
[im 77/383  soft-tissue]
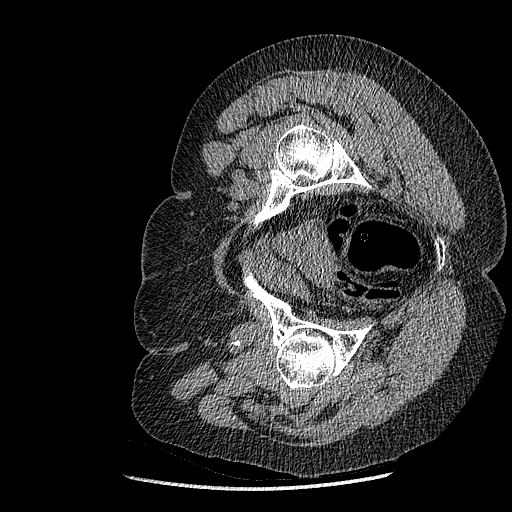
[im 153/383  soft-tissue]
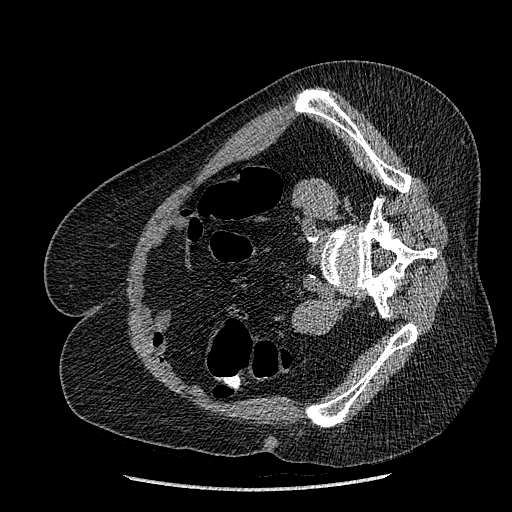

[Series 601: coronal body · coronal · 0.93mm/px · 1 of 131 slices shown, 2 images]
[im 44/131  soft-tissue]
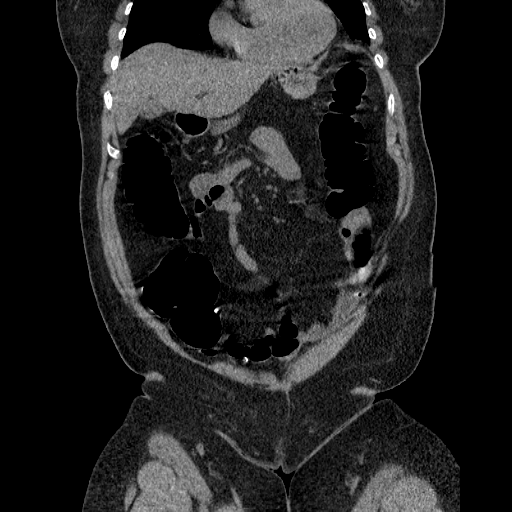
[im 44/131  bone]
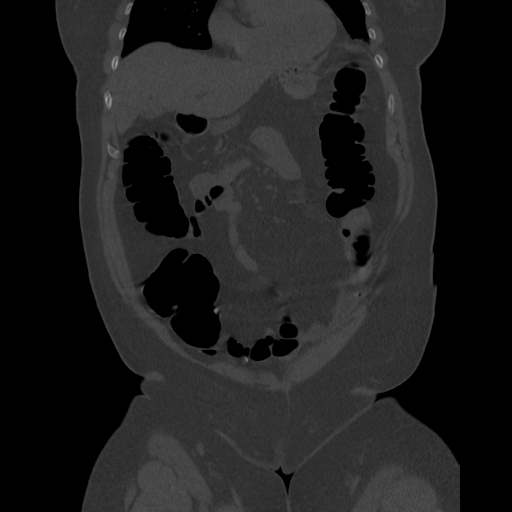

[12 of 36 positions shown; findings below may reference images not displayed]

FINDINGS: Both 2D and 3D images of the colon were performed and reviewed in
prone and supine positions. The rectosigmoid colon extends out of
the pelvis to the splenic flexure of colon, and the colon is
tortuous as well as elongated. There is a moderate amount of
retained fluid and feces which is relatively well tagged. However
this retained fluid and feces makes exclusion of small polyps
difficult. No clinically significant polypoid lesion is seen. The
ileocecal valve is unremarkable.
IMPRESSION: 1. Moderate amount of retained tagged fluid and feces makes
exclusion of small polyps difficult. However no clinically
significant polypoid lesion is seen.
2. Elongated and tortuous colon.

Virtual colonoscopy is not designed to detect diminutive polyps
(i.e., less than or equal to 5 mm), the presence or absence of which
may not affect clinical management.

CT ABDOMEN AND PELVIS WITHOUT CONTRAST
FINDINGS: The lung bases are clear. The liver is unremarkable in the
unenhanced state with a single calcified granuloma medially. No
calcified gallstones are seen. The pancreas is normal in size and
the pancreatic duct is not dilated. A single 3 mm nonobstructing mid
right renal calculus is noted. No hydronephrosis is seen. The
abdominal aorta is normal in caliber.

The uterus is difficult to visualize on this unenhanced study but
appears unremarkable. No adnexal lesion is seen and no fluid is
noted within the pelvis. The urinary bladder is decompressed. Fat
does enter the right inguinal canal. The lumbar vertebrae are in
normal alignment. There are degenerative changes throughout the
facet joints of the lower lumbar spine.

1. Single nonobstructing 3 mm right mid renal calculus.
2. Fat enters the right inguinal canal.

## 2019-10-06 ENCOUNTER — Other Ambulatory Visit (HOSPITAL_COMMUNITY)
Admission: RE | Admit: 2019-10-06 | Discharge: 2019-10-06 | Disposition: A | Discharge status: Home / Self Care | Payer: Medicare Other | Source: Ambulatory Visit | Attending: Urology | Admitting: Urology

## 2019-10-06 DIAGNOSIS — Z20822 Contact with and (suspected) exposure to covid-19: Secondary | ICD-10-CM | POA: Diagnosis not present

## 2019-10-06 DIAGNOSIS — Z01812 Encounter for preprocedural laboratory examination: Secondary | ICD-10-CM | POA: Insufficient documentation

## 2019-10-06 LAB — SARS CORONAVIRUS 2 (TAT 6-24 HRS): SARS Coronavirus 2: NEGATIVE

## 2019-10-07 ENCOUNTER — Other Ambulatory Visit: Payer: Self-pay | Admitting: Urology

## 2019-10-07 NOTE — H&P (Signed)
Office Visit Report     09/23/2019   --------------------------------------------------------------------------------   Rita Nixon  MRN: [id number]  DOB: 07-23-1945, 74 year old Female  SSN:    PRIMARY CARE:    REFERRING:    PROVIDER:  Ellison Hughs, M.D.  LOCATION:  [facility] [phone]     --------------------------------------------------------------------------------   CC/HPI: CC: Kidney stone   HPI: Rita Nixon is a 74 year old female who was found to have a non-obstructing 8 mm left renal stone on KUB and RUS during a work-up for right flank pain in January. She has a long history of kidney stones and notes that she has passed several small stones in the past and has never required surgery. She denies flank pain, dysuria or hematuria today. She has ongoing history of vaginal dryness and was told by another physician to apply Crisco to the vagina to alleviate this. She has a remote history of UTIs. UA today is cleared.     ALLERGIES: percocet    MEDICATIONS: Amlodipine Besilate  Fish Oil  Garlic     GU PSH: None     PSH Notes: teeth extraction    NON-GU PSH: Appendectomy Cesarean Delivery     GU PMH: None   NON-GU PMH: Arthritis GERD Hypertension    FAMILY HISTORY: 2 sons - No Family History Cancer - Runs in Family   SOCIAL HISTORY: Marital Status: Widowed Preferred Language: English; Race: Black or African American Current Smoking Status: Patient has never smoked.   Tobacco Use Assessment Completed: Used Tobacco in last 30 days? Has never drank.  Drinks 1 caffeinated drink per day.    REVIEW OF SYSTEMS:    GU Review Female:   Patient reports get up at night to urinate. Patient denies frequent urination, hard to postpone urination, burning /pain with urination, leakage of urine, stream starts and stops, trouble starting your stream, have to strain to urinate, and being pregnant.  Gastrointestinal (Upper):   Patient  reports indigestion/ heartburn. Patient denies nausea and vomiting.  Gastrointestinal (Lower):   Patient denies diarrhea and constipation.  Constitutional:   Patient denies fever, night sweats, weight loss, and fatigue.  Skin:   Patient reports itching. Patient denies skin rash/ lesion.  Eyes:   Patient denies blurred vision and double vision.  Ears/ Nose/ Throat:   Patient reports sinus problems. Patient denies sore throat.  Hematologic/Lymphatic:   Patient denies swollen glands and easy bruising.  Cardiovascular:   Patient denies leg swelling and chest pains.  Respiratory:   Patient denies cough and shortness of breath.  Endocrine:   Patient denies excessive thirst.  Musculoskeletal:   Patient reports back pain. Patient denies joint pain.  Neurological:   Patient denies headaches and dizziness.  Psychologic:   Patient denies depression and anxiety.   Notes: pt gets up at night 2-3 times , vaginal itching GYN advises lost of estrogen. No history of Breast Cancer    VITAL SIGNS:      09/23/2019 09:28 AM  Weight 170 lb / 77.11 kg  Height 66 in / 167.64 cm  BP 128/80 mmHg  Heart Rate 80 /min  Temperature 97.3 F / 36.2 C  BMI 27.4 kg/m   MULTI-SYSTEM PHYSICAL EXAMINATION:    Constitutional: Well-nourished. No physical deformities. Normally developed. Good grooming.  Neurologic / Psychiatric: Oriented to time, oriented to place, oriented to person. No depression, no anxiety, no agitation.  Musculoskeletal: Normal gait and station of head and neck.  PAST DATA REVIEWED:  Source Of History:  Patient  Records Review:   Previous Patient Records  X-Ray Review: Outside X-Ray: Reviewed Films. Reviewed Report. Discussed With Patient.    Notes:                     CLINICAL DATA: History of a left renal stone by prior CT scan.   EXAM:  ABDOMEN - 1 VIEW   COMPARISON: CT abdomen and pelvis 04/27/2019. KUB 01/22/2019.   FINDINGS:  A 0.6 cm calcification projecting over the mid pole of  the left  kidney corresponds with a nonobstructing stone seen on the prior CT.  The right ureteral stone seen on prior CT is not visible on this  exam. Bowel gas pattern is unremarkable. Tubal ligation clips are  noted. No acute bony abnormality.   IMPRESSION:  0.6 cm stone mid pole left kidney. Punctate right ureteral stone  seen on prior CT is not visible on this exam.    Electronically Signed  By: Drusilla Kanner M.D.  On: 07/18/2019 11:29   PROCEDURES:         KUB - [id number]  A single view of the abdomen is obtained.      Patient confirmed No Neulasta OnPro Device.            Urinalysis Dipstick Dipstick Cont'd  Color: Yellow Bilirubin: Neg mg/dL  Appearance: Clear Ketones: Neg mg/dL  Specific Gravity: 3.664 Blood: Neg ery/uL  pH: 6.0 Protein: Neg mg/dL  Glucose: Neg mg/dL Urobilinogen: 0.2 mg/dL    Nitrites: Neg    Leukocyte Esterase: Neg leu/uL    ASSESSMENT:      ICD-10 Details  1 GU:   Renal calculus - N20.0 Left, Undiagnosed New Problem - 6 mm left mid pole renal stone   PLAN:            Medications New Meds: Estradiol 0.01 % cream with applicator Apply a pea-sized amnt around the urethra & vaginal opening once daily for 2 weeks, then transition to every other day.   #30  5 Refill(s)            Orders X-Rays: KUB          Schedule Return Visit/Planned Activity: Next Available Appointment - Schedule Surgery          Document Letter(s):  Created for Patient: Clinical Summary         Notes:   -KUB today shows a calcification within the left renal shadow at the tip of the 12th rib  -The risks, benefits and alternatives of LEFT ESWL was discussed with the patient. I described the risks which include arrhythmia, kidney contusion, kidney hemorrhage, need for transfusion, back discomfort, flank ecchymosis, flank abrasion, inability to break up stone, inability to pass stone fragments, Steinstrasse, infection associated with obstructing stones, need for  different surgical procedure and possible need for repeat shockwave lithotripsy. The patient voices understanding and wishes to proceed.     * Signed by Rhoderick Moody, M.D. on 09/23/19 at 11:58 AM (EDT)*     The information contained in this medical record document is considered private and confidential patient information. This information can only be used for the medical diagnosis and/or medical services that are being provided by the patient's selected caregivers. This information can only be distributed outside of the patient's care if the patient agrees and signs waivers of authorization for this information to be sent to an outside source or route.

## 2019-10-07 NOTE — Progress Notes (Signed)
Talked with patient concerning procedure on Monday. Arrival time 1030. NPO after MN verbalized understanding

## 2019-10-10 ENCOUNTER — Ambulatory Visit (HOSPITAL_COMMUNITY): Payer: Medicare Other

## 2019-10-10 ENCOUNTER — Ambulatory Visit (HOSPITAL_BASED_OUTPATIENT_CLINIC_OR_DEPARTMENT_OTHER)
Admission: RE | Admit: 2019-10-10 | Discharge: 2019-10-10 | Disposition: A | Discharge status: Home / Self Care | Payer: Medicare Other | Attending: Urology | Admitting: Urology

## 2019-10-10 ENCOUNTER — Encounter (HOSPITAL_BASED_OUTPATIENT_CLINIC_OR_DEPARTMENT_OTHER): Payer: Self-pay | Admitting: Urology

## 2019-10-10 ENCOUNTER — Other Ambulatory Visit: Payer: Self-pay

## 2019-10-10 ENCOUNTER — Encounter (HOSPITAL_BASED_OUTPATIENT_CLINIC_OR_DEPARTMENT_OTHER)
Admission: RE | Disposition: A | Discharge status: Home / Self Care | Payer: Self-pay | Source: Home / Self Care | Attending: Urology

## 2019-10-10 DIAGNOSIS — M1990 Unspecified osteoarthritis, unspecified site: Secondary | ICD-10-CM | POA: Diagnosis not present

## 2019-10-10 DIAGNOSIS — Z885 Allergy status to narcotic agent status: Secondary | ICD-10-CM | POA: Insufficient documentation

## 2019-10-10 DIAGNOSIS — Z79899 Other long term (current) drug therapy: Secondary | ICD-10-CM | POA: Insufficient documentation

## 2019-10-10 DIAGNOSIS — I10 Essential (primary) hypertension: Secondary | ICD-10-CM | POA: Diagnosis not present

## 2019-10-10 DIAGNOSIS — N200 Calculus of kidney: Secondary | ICD-10-CM | POA: Diagnosis not present

## 2019-10-10 DIAGNOSIS — D689 Coagulation defect, unspecified: Secondary | ICD-10-CM | POA: Diagnosis not present

## 2019-10-10 DIAGNOSIS — K219 Gastro-esophageal reflux disease without esophagitis: Secondary | ICD-10-CM | POA: Diagnosis not present

## 2019-10-10 HISTORY — PX: EXTRACORPOREAL SHOCK WAVE LITHOTRIPSY: SHX1557

## 2019-10-10 SURGERY — LITHOTRIPSY, ESWL
Anesthesia: LOCAL | Laterality: Left

## 2019-10-10 MED ORDER — DIAZEPAM 5 MG PO TABS
10.0000 mg | ORAL_TABLET | ORAL | Status: AC
  Administered 2019-10-10: 10 mg via ORAL
  Filled 2019-10-10: qty 2

## 2019-10-10 MED ORDER — DIPHENHYDRAMINE HCL 25 MG PO CAPS
25.0000 mg | ORAL_CAPSULE | ORAL | Status: AC
  Administered 2019-10-10: 25 mg via ORAL
  Filled 2019-10-10: qty 1

## 2019-10-10 MED ORDER — SODIUM CHLORIDE 0.9 % IV SOLN
INTRAVENOUS | Status: DC
  Filled 2019-10-10: qty 1000

## 2019-10-10 MED ORDER — CIPROFLOXACIN HCL 500 MG PO TABS
500.0000 mg | ORAL_TABLET | ORAL | Status: AC
  Administered 2019-10-10: 500 mg via ORAL
  Filled 2019-10-10: qty 1

## 2019-10-10 MED ORDER — CIPROFLOXACIN HCL 500 MG PO TABS
ORAL_TABLET | ORAL | Status: AC
  Filled 2019-10-10: qty 1

## 2019-10-10 MED ORDER — DIAZEPAM 5 MG PO TABS
ORAL_TABLET | ORAL | Status: AC
  Filled 2019-10-10: qty 2

## 2019-10-10 MED ORDER — TAMSULOSIN HCL 0.4 MG PO CAPS: 0.4000 mg | ORAL_CAPSULE | Freq: Every day | ORAL | 0 refills | Status: AC

## 2019-10-10 MED ORDER — DIPHENHYDRAMINE HCL 25 MG PO CAPS
ORAL_CAPSULE | ORAL | Status: AC
  Filled 2019-10-10: qty 1

## 2019-10-10 NOTE — Interval H&P Note (Signed)
History and Physical Interval Note:  10/10/2019 1:12 PM  Rita Nixon  has presented today for surgery, with the diagnosis of LEFT RENAL STONE.  The various methods of treatment have been discussed with the patient and family. After consideration of risks, benefits and other options for treatment, the patient has consented to  Procedure(s): EXTRACORPOREAL SHOCK WAVE LITHOTRIPSY (ESWL) (Left) as a surgical intervention.  The patient's history has been reviewed, patient examined, no change in status, stable for surgery. No dysuria or hematuria. No fever. Stone remains left kidney on KUB. I have reviewed the patient's chart and labs.  Questions were answered to the patient's satisfaction.     Jerilee Field

## 2019-10-10 NOTE — Op Note (Signed)
Left ESWL   Left renal stone   Findings: She moved a bit. Left renal stone faded some but she may need a staged procedure. She tolerated well.

## 2019-10-10 NOTE — Discharge Instructions (Signed)
Lithotripsy, Care After This sheet gives you information about how to care for yourself after your procedure. Your health care provider may also give you more specific instructions. If you have problems or questions, contact your health care provider. What can I expect after the procedure? After the procedure, it is common to have:  Some blood in your urine. This should only last for a few days.  Soreness in your back, sides, or upper abdomen for a few days.  Blotches or bruises on your back where the pressure wave entered the skin.  Pain, discomfort, or nausea when pieces (fragments) of the kidney stone move through the tube that carries urine from the kidney to the bladder (ureter). Stone fragments may pass soon after the procedure, but they may continue to pass for up to 4-8 weeks. ? If you have severe pain or nausea, contact your health care provider. This may be caused by a large stone that was not broken up, and this may mean that you need more treatment.  Some pain or discomfort during urination.  Some pain or discomfort in the lower abdomen or (in men) at the base of the penis. Follow these instructions at home: Medicines  Take over-the-counter and prescription medicines only as told by your health care provider.  If you were prescribed an antibiotic medicine, take it as told by your health care provider. Do not stop taking the antibiotic even if you start to feel better.  Do not drive for 24 hours if you were given a medicine to help you relax (sedative).  Do not drive or use heavy machinery while taking prescription pain medicine. Eating and drinking      Drink enough water and fluids to keep your urine clear or pale yellow. This helps any remaining pieces of the stone to pass. It can also help prevent new stones from forming.  Eat plenty of fresh fruits and vegetables.  Follow instructions from your health care provider about eating and drinking restrictions. You may be  instructed: ? To reduce how much salt (sodium) you eat or drink. Check ingredients and nutrition facts on packaged foods and beverages. ? To reduce how much meat you eat.  Eat the recommended amount of calcium for your age and gender. Ask your health care provider how much calcium you should have. General instructions  Get plenty of rest.  Most people can resume normal activities 1-2 days after the procedure. Ask your health care provider what activities are safe for you.  Your health care provider may direct you to lie in a certain position (postural drainage) and tap firmly (percuss) over your kidney area to help stone fragments pass. Follow instructions as told by your health care provider.  If directed, strain all urine through the strainer that was provided by your health care provider. ? Keep all fragments for your health care provider to see. Any stones that are found may be sent to a medical lab for examination. The stone may be as small as a grain of salt.  Keep all follow-up visits as told by your health care provider. This is important. Contact a health care provider if:  You have pain that is severe or does not get better with medicine.  You have nausea that is severe or does not go away.  You have blood in your urine longer than your health care provider told you to expect.  You have more blood in your urine.  You have pain during urination that does   not go away.  You urinate more frequently than usual and this does not go away.  You develop a rash or any other possible signs of an allergic reaction. Get help right away if:  You have severe pain in your back, sides, or upper abdomen.  You have severe pain while urinating.  Your urine is very dark red.  You have blood in your stool (feces).  You cannot pass any urine at all.  You feel a strong urge to urinate after emptying your bladder.  You have a fever or chills.  You develop shortness of breath,  difficulty breathing, or chest pain.  You have severe nausea that leads to persistent vomiting.  You faint. Summary  After this procedure, it is common to have some pain, discomfort, or nausea when pieces (fragments) of the kidney stone move through the tube that carries urine from the kidney to the bladder (ureter). If this pain or nausea is severe, however, you should contact your health care provider.  Most people can resume normal activities 1-2 days after the procedure. Ask your health care provider what activities are safe for you.  Drink enough water and fluids to keep your urine clear or pale yellow. This helps any remaining pieces of the stone to pass, and it can help prevent new stones from forming.  If directed, strain your urine and keep all fragments for your health care provider to see. Fragments or stones may be as small as a grain of salt.  Get help right away if you have severe pain in your back, sides, or upper abdomen or have severe pain while urinating. This information is not intended to replace advice given to you by your health care provider. Make sure you discuss any questions you have with your health care provider. Document Revised: 09/27/2018 Document Reviewed: 05/07/2016 Elsevier Patient Education  2020 [employer]    Post Anesthesia Home Care Instructions  Activity: Get plenty of rest for the remainder of the day. A responsible individual must stay with you for 24 hours following the procedure.  For the next 24 hours, DO NOT: -Drive a car -Operate machinery -Drink alcoholic beverages -Take any medication unless instructed by your physician -Make any legal decisions or sign important papers.  Meals: Start with liquid foods such as gelatin or soup. Progress to regular foods as tolerated. Avoid greasy, spicy, heavy foods. If nausea and/or vomiting occur, drink only clear liquids until the nausea and/or vomiting subsides. Call your physician if vomiting  continues.  Special Instructions/Symptoms: Your throat may feel dry or sore from the anesthesia or the breathing tube placed in your throat during surgery. If this causes discomfort, gargle with warm salt water. The discomfort should disappear within 24 hours.  If you had a scopolamine patch placed behind your ear for the management of post- operative nausea and/or vomiting:  1. The medication in the patch is effective for 72 hours, after which it should be removed.  Wrap patch in a tissue and discard in the trash. Wash hands thoroughly with soap and water. 2. You may remove the patch earlier than 72 hours if you experience unpleasant side effects which may include dry mouth, dizziness or visual disturbances. 3. Avoid touching the patch. Wash your hands with soap and water after contact with the patch.     

## 2019-10-10 NOTE — Interval H&P Note (Signed)
History and Physical Interval Note:  10/10/2019 1:15 PM  I wanted to add that I went over the nature, potential benefits, risks and alternatives to left ESWL with the patient, including side effects of the proposed treatment, the likelihood of the patient achieving the goals of the procedure, and any potential problems that might occur during the procedure or recuperation. All questions answered. Patient elects to proceed.     Jerilee Field

## 2021-10-24 IMAGING — DX DG ABDOMEN 1V
1 series · 1 of 1 positions shown · non-contrast
Comparison: September 23, 2019.

CLINICAL DATA: Left nephrolithiasis.

EXAM:
ABDOMEN - 1 VIEW

[abdomen kub]
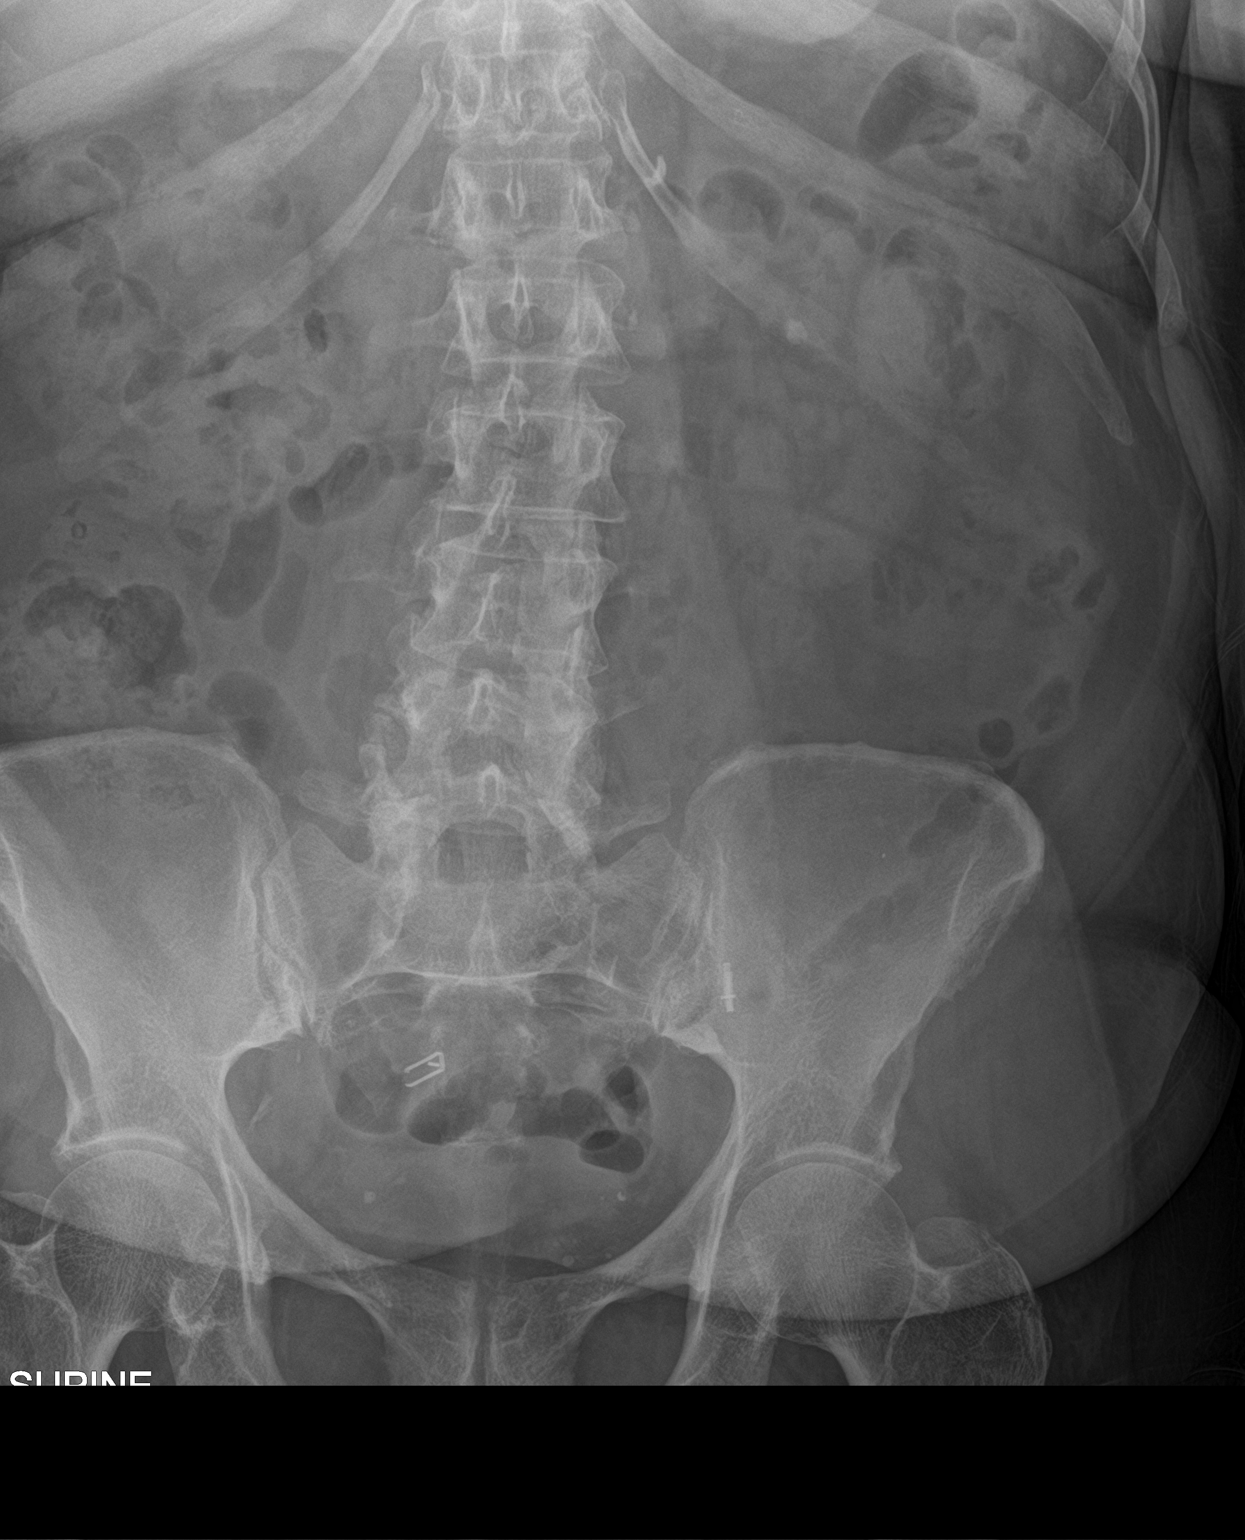

[1 of 1 positions shown; findings below may reference images not displayed]

FINDINGS: The bowel gas pattern is normal. Stable left renal calculus is
noted. Phleboliths are noted in the pelvis.
IMPRESSION: Stable left renal calculus.
